# Patient Record
Sex: Male | Born: 1970 | Race: Black or African American | Hispanic: No | Marital: Single | Smoking: Current every day smoker
Health system: Southern US, Community
[De-identification: ages and names within clinical notes are randomized; demographics above are authoritative.]

## PROBLEM LIST (undated history)

## (undated) DIAGNOSIS — I10 Essential (primary) hypertension: Secondary | ICD-10-CM

## (undated) HISTORY — PX: TESTICLE REMOVAL: SHX68

---

## 2007-01-30 ENCOUNTER — Emergency Department: Payer: Self-pay | Admitting: Emergency Medicine

## 2007-04-10 ENCOUNTER — Emergency Department: Payer: Self-pay | Admitting: Emergency Medicine

## 2007-04-16 ENCOUNTER — Encounter: Payer: Self-pay | Admitting: Internal Medicine

## 2007-04-22 ENCOUNTER — Encounter (HOSPITAL_COMMUNITY): Admission: RE | Admit: 2007-04-22 | Discharge: 2007-05-22 | Payer: Self-pay | Admitting: Orthopaedic Surgery

## 2007-04-28 ENCOUNTER — Encounter: Payer: Self-pay | Admitting: Internal Medicine

## 2007-05-24 ENCOUNTER — Encounter (HOSPITAL_COMMUNITY): Admission: RE | Admit: 2007-05-24 | Discharge: 2007-06-23 | Payer: Self-pay | Admitting: Orthopaedic Surgery

## 2007-05-29 ENCOUNTER — Encounter: Payer: Self-pay | Admitting: Internal Medicine

## 2008-09-17 IMAGING — CR DG CHEST 1V
1 series · 1 of 1 positions shown · non-contrast
Comparison: none

REASON FOR EXAM: CRUSH INJURY
COMMENTS:

PROCEDURE:     DXR - DXR CHEST 1 VIEWAP OR PA  - April 10, 2007  [DATE]
RESULT:     The lung fields are clear.  The heart, mediastinal and osseous
structures show no significant abnormalities.

[view not recorded]
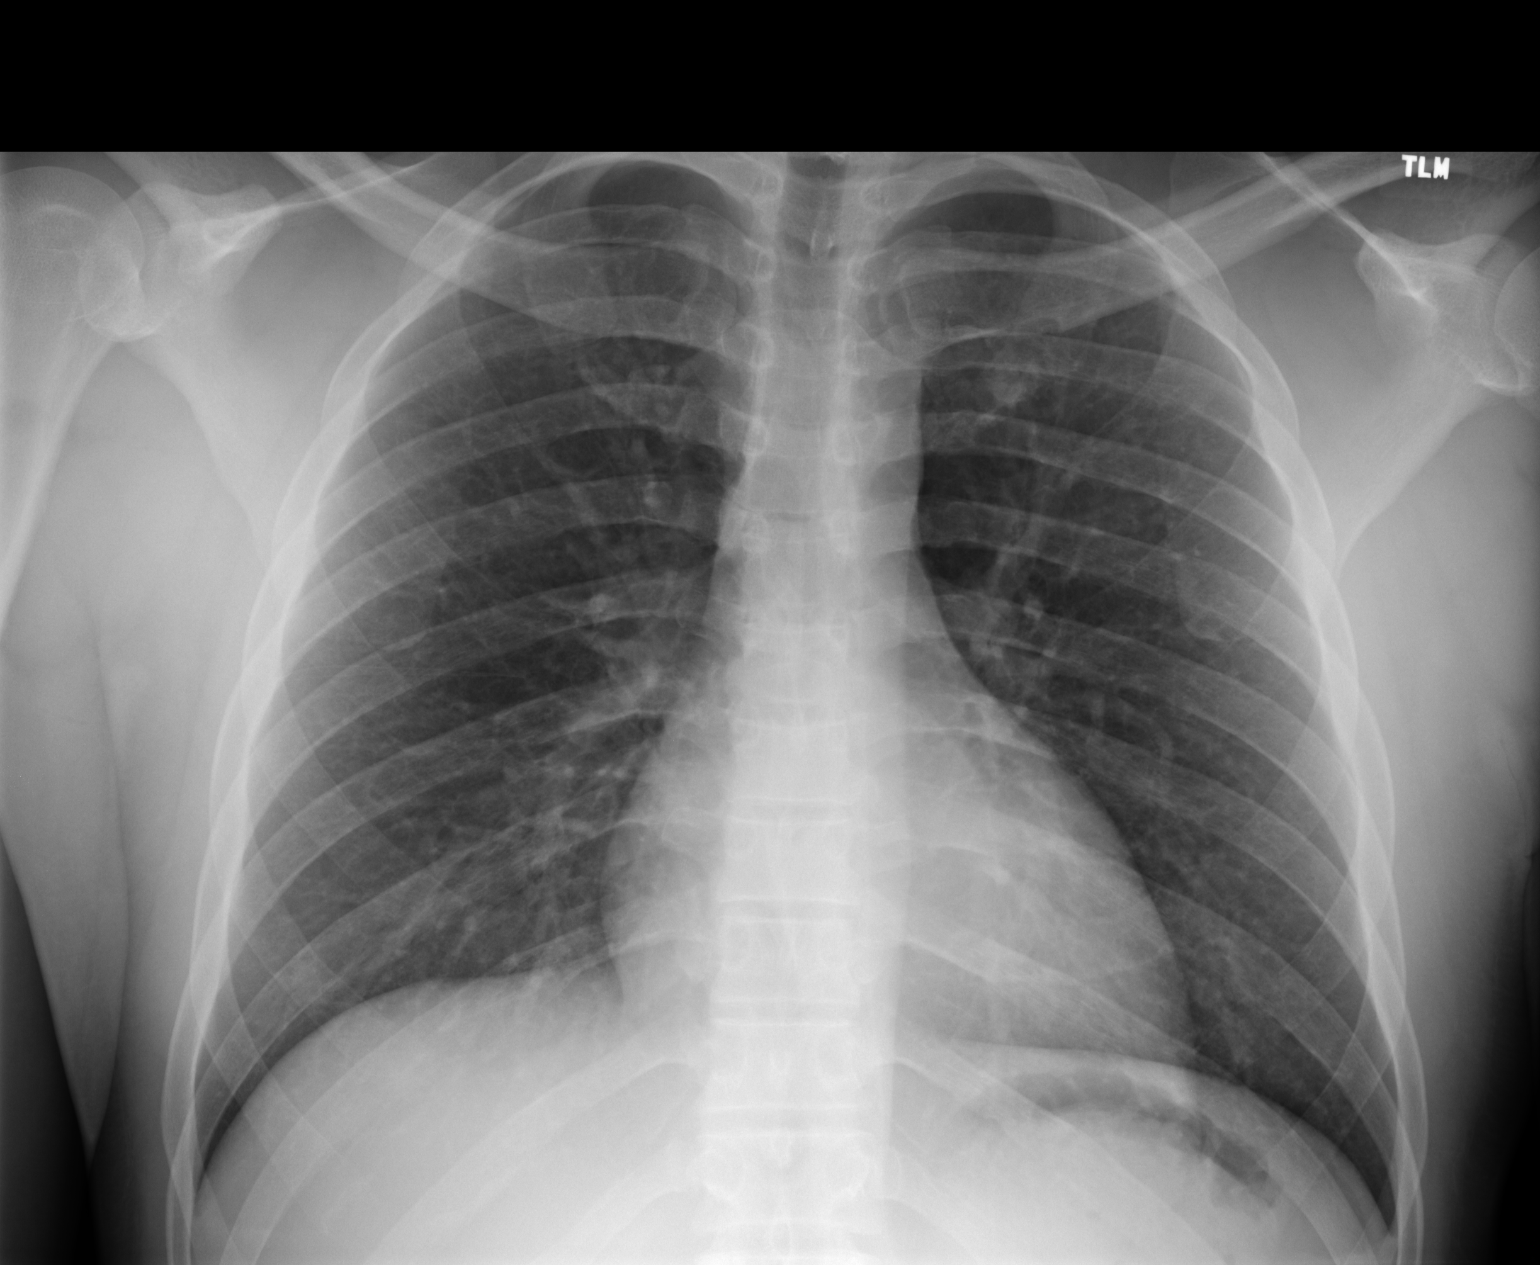

[1 of 1 positions shown; findings below may reference images not displayed]

IMPRESSION: No significant abnormalities are noted.

## 2008-09-17 IMAGING — CR RIGHT FOREARM - 2 VIEW
1 series · 2 of 2 positions shown · non-contrast
Comparison: none

REASON FOR EXAM: crush injury
COMMENTS:

PROCEDURE:     DXR - DXR FOREARM RIGHT  - April 10, 2007  [DATE]
RESULT:     No fracture, dislocation or other acute bony abnormality is
identified. No radiodense soft tissue foreign body is seen.

[Series 1: view not recorded · 0.17mm/px · 2 of 2 slices shown]
[im 1/2]
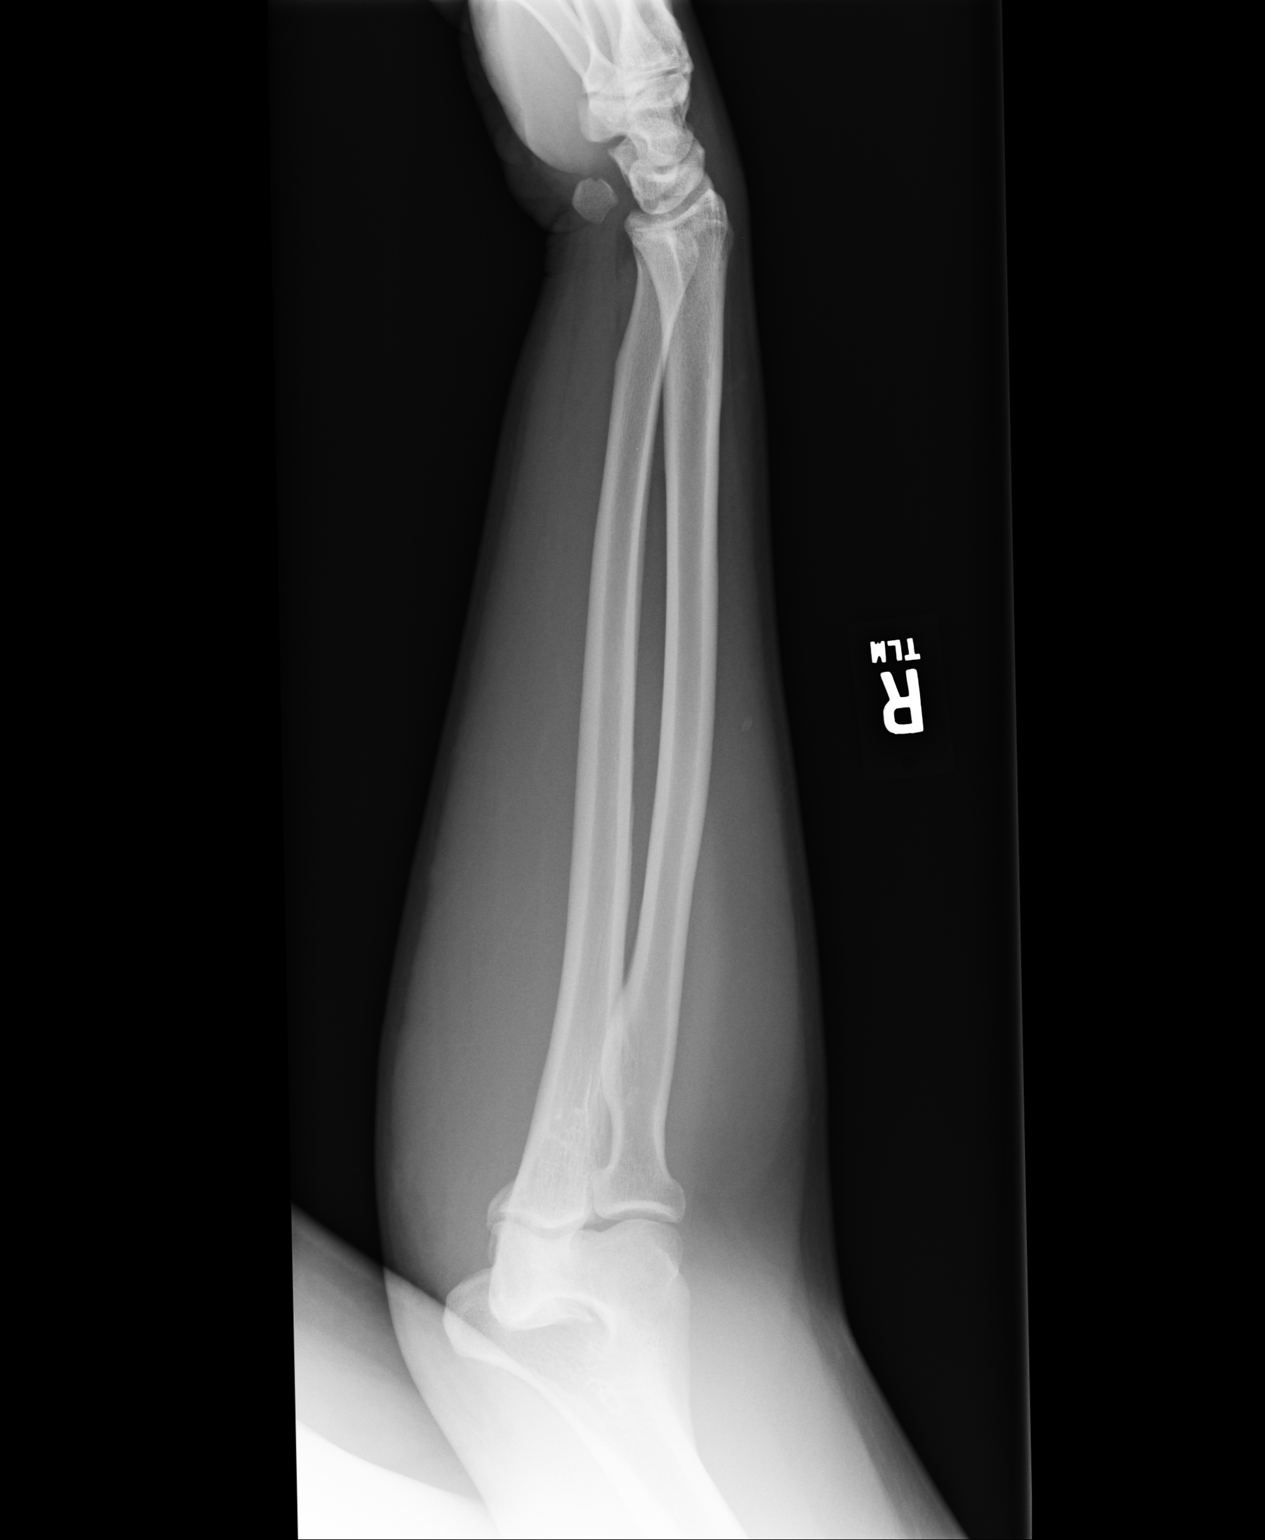
[im 2/2]
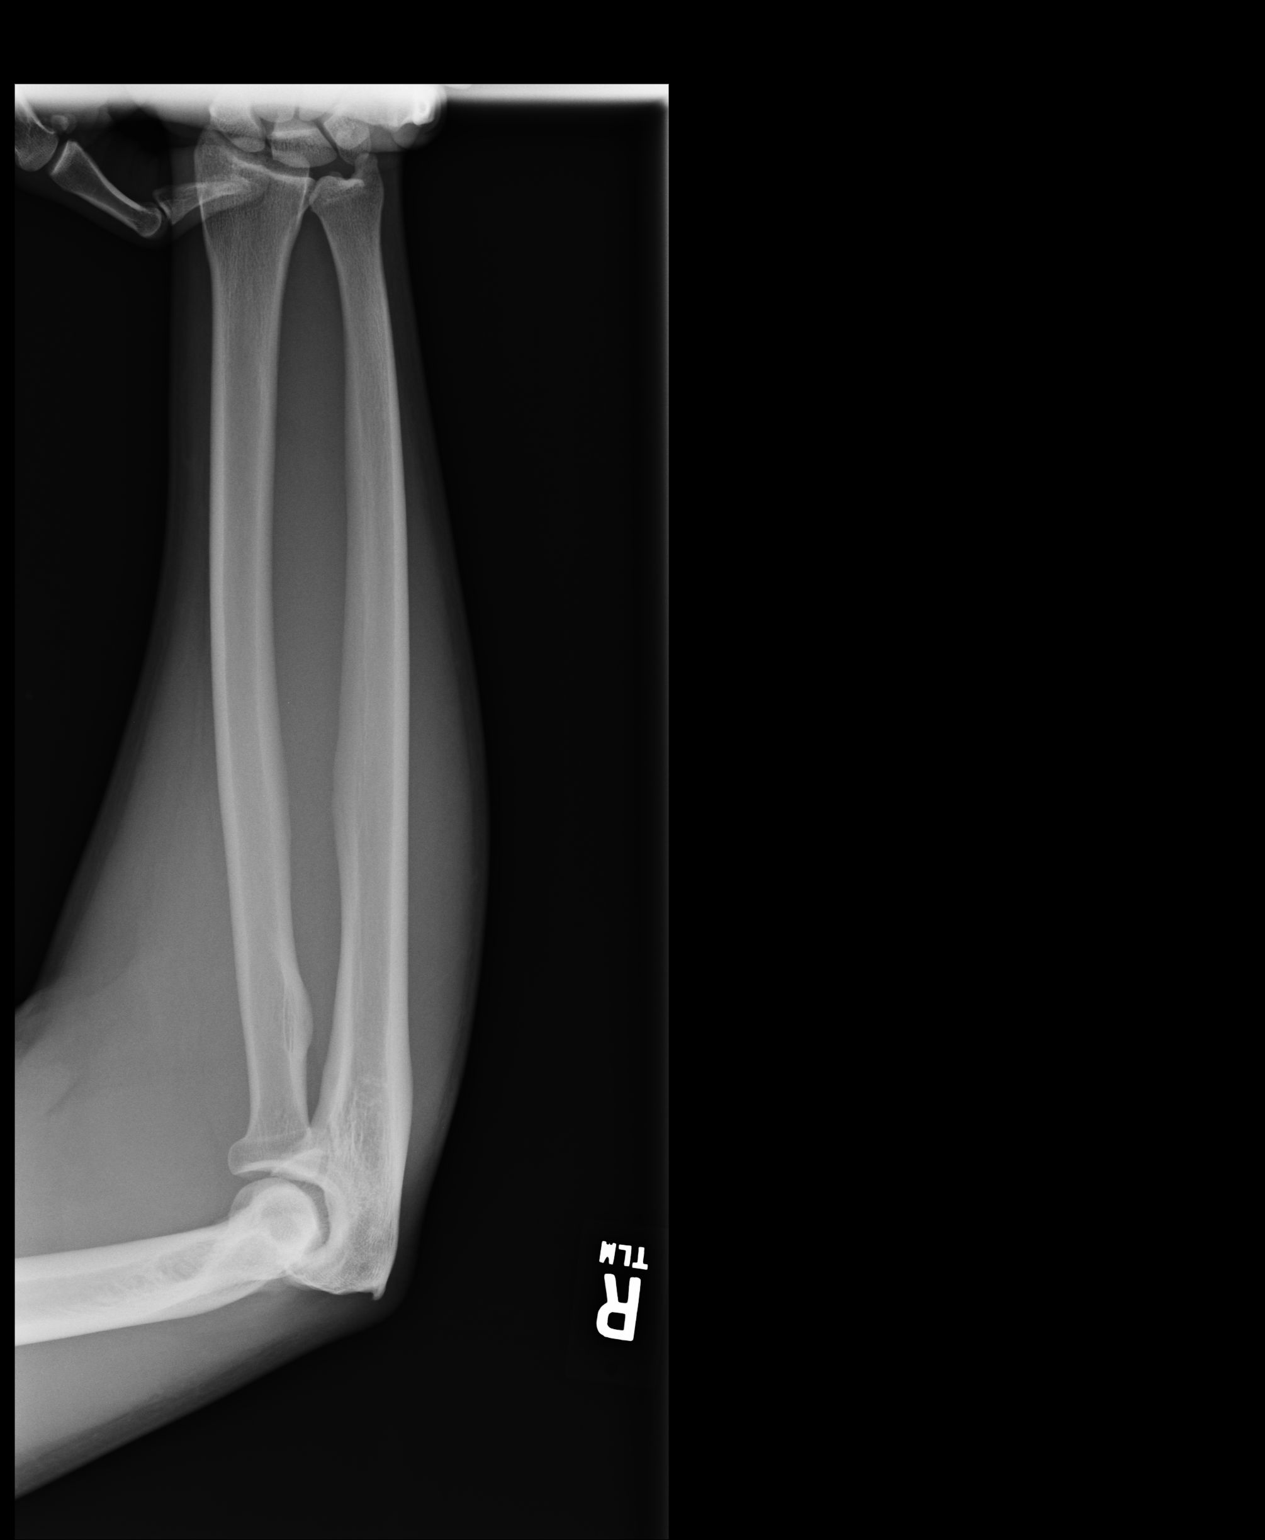

[2 of 2 positions shown; findings below may reference images not displayed]

IMPRESSION: No significant abnormalities are noted.

## 2016-10-17 DIAGNOSIS — R52 Pain, unspecified: Secondary | ICD-10-CM | POA: Insufficient documentation

## 2016-10-17 DIAGNOSIS — R200 Anesthesia of skin: Secondary | ICD-10-CM | POA: Insufficient documentation

## 2020-12-12 ENCOUNTER — Other Ambulatory Visit: Payer: Self-pay

## 2020-12-12 ENCOUNTER — Encounter (HOSPITAL_COMMUNITY): Payer: Self-pay | Admitting: *Deleted

## 2020-12-12 ENCOUNTER — Emergency Department (HOSPITAL_COMMUNITY)
Admission: EM | Admit: 2020-12-12 | Discharge: 2020-12-12 | Disposition: A | Discharge status: Home / Self Care | Payer: BC Managed Care – PPO | Attending: Emergency Medicine | Admitting: Emergency Medicine

## 2020-12-12 DIAGNOSIS — T1591XA Foreign body on external eye, part unspecified, right eye, initial encounter: Secondary | ICD-10-CM

## 2020-12-12 DIAGNOSIS — T1581XA Foreign body in other and multiple parts of external eye, right eye, initial encounter: Secondary | ICD-10-CM | POA: Insufficient documentation

## 2020-12-12 DIAGNOSIS — X58XXXA Exposure to other specified factors, initial encounter: Secondary | ICD-10-CM | POA: Insufficient documentation

## 2020-12-12 DIAGNOSIS — F17210 Nicotine dependence, cigarettes, uncomplicated: Secondary | ICD-10-CM | POA: Diagnosis not present

## 2020-12-12 MED ORDER — TETRACAINE HCL 0.5 % OP SOLN
1.0000 [drp] | Freq: Once | OPHTHALMIC | Status: AC
  Administered 2020-12-12: 1 [drp] via OPHTHALMIC
  Filled 2020-12-12: qty 4

## 2020-12-12 MED ORDER — ERYTHROMYCIN 5 MG/GM OP OINT
TOPICAL_OINTMENT | Freq: Once | OPHTHALMIC | Status: AC
  Administered 2020-12-12: 1 via OPHTHALMIC
  Filled 2020-12-12: qty 3.5

## 2020-12-12 NOTE — ED Provider Notes (Signed)
[facility] EMERGENCY DEPARTMENT Provider Note   CSN: [id number] Arrival date & time: 12/12/20  1522     History Chief Complaint  Patient presents with   Eye Problem    Jonathan Alvarez is a 50 y.o. male.  Patient  here with FB to R eye. Patient states that today around 1pm he was loading wood onto a truck when he felt a piece of wood go into his eye. He states that the discomfort is worse with blinking, better when he keeps his eye closed. He has tearing but denies photophobia or significant pain.  The history is provided by the patient.  Eye Problem Location:  Right eye Quality:  Foreign body sensation Severity:  Moderate Onset quality:  Sudden Duration:  5 hours Timing:  Constant Progression:  Unchanged Chronicity:  New Context: foreign body   Foreign body:  Wood Relieved by:  Nothing Worsened by:  Eye movement Ineffective treatments:  None tried Associated symptoms: redness and tearing   Associated symptoms: no blurred vision, no crusting, no decreased vision and no photophobia       History reviewed. No pertinent past medical history.  There are no problems to display for this patient.   History reviewed. No pertinent surgical history.     No family history on file.  Social History   Tobacco Use   Smoking status: Every Day    Types: Cigarettes   Smokeless tobacco: Never    Home Medications Prior to Admission medications   Not on File    Allergies    Patient has no known allergies.  Review of Systems   Review of Systems  Eyes:  Positive for redness. Negative for blurred vision and photophobia.   Physical Exam Updated Vital Signs BP (!) 149/91 (BP Location: Right Arm)   Pulse 70   Temp 98.4 F (36.9 C) (Oral)   Resp 18   SpO2 96%   Physical Exam Vitals and nursing note reviewed.  Constitutional:      General: He is not in acute distress.    Appearance: He is well-developed. He is not diaphoretic.  HENT:     Head: Normocephalic  and atraumatic.  Eyes:     General: No scleral icterus.    Conjunctiva/sclera: Conjunctivae normal.  Cardiovascular:     Rate and Rhythm: Normal rate and regular rhythm.     Heart sounds: Normal heart sounds.  Pulmonary:     Effort: Pulmonary effort is normal. No respiratory distress.     Breath sounds: Normal breath sounds.  Abdominal:     Palpations: Abdomen is soft.     Tenderness: There is no abdominal tenderness.  Musculoskeletal:     Cervical back: Normal range of motion and neck supple.  Skin:    General: Skin is warm and dry.  Neurological:     Mental Status: He is alert.  Psychiatric:        Behavior: Behavior normal.    ED Results / Procedures / Treatments   Labs (all labs ordered are listed, but only abnormal results are displayed) Labs Reviewed - No data to display  EKG None  Radiology No results found.  Procedures .Foreign Body Removal  Date/Time: 12/12/2020 7:28 PM Performed by: Arthor Captain, PA-C Authorized by: Arthor Captain, PA-C  Consent: Verbal consent obtained. Risks and benefits: risks, benefits and alternatives were discussed Consent given by: patient Patient understanding: patient states understanding of the procedure being performed Patient consent: the patient's understanding of the procedure matches consent  given Patient identity confirmed: provided demographic data Body area: eye Location details: right eyelid  Anesthesia: Local Anesthetic: tetracaine drops Patient cooperative: yes Localization method: eyelid eversion and slit lamp Removal mechanism: moist cotton swab Dressing: antibiotic ointment Depth: superficial Complexity: simple 1 objects recovered. Objects recovered: wood chip Post-procedure assessment: foreign body removed    Medications Ordered in ED Medications  tetracaine (PONTOCAINE) 0.5 % ophthalmic solution 1 drop (1 drop Right Eye Given 12/12/20 1858)  erythromycin ophthalmic ointment (1 application Right  Eye Given 12/12/20 1936)    ED Course  I have reviewed the triage vital signs and the nursing notes.  Pertinent labs & imaging results that were available during my care of the patient were reviewed by me and considered in my medical decision making (see chart for details).    MDM Rules/Calculators/A&P                          Patient here with wood chip in the right thigh.  With chip localized and successfully removed with wet cotton swab.  No evidence of corneal abrasion.  Patient will be treated with erythromycin ointment.  Advised to call ophthalmology tomorrow for close follow-up.  He appears otherwise appropriate for discharge at this time with significant improvement in his symptoms  Final Clinical Impression(s) / ED Diagnoses Final diagnoses:  Foreign body of right eye, initial encounter    Rx / DC Orders ED Discharge Orders     None        Arthor Captain, PA-C 12/13/20 1006    Pricilla Loveless, MD 12/15/20 1457

## 2020-12-12 NOTE — ED Triage Notes (Signed)
Possible foreign body right eye

## 2020-12-12 NOTE — Discharge Instructions (Signed)
Put 1/2 inch strip of erythromycin ointment in the R lower eyelid 4 times a day for the next 5 days. Contact a health care provider if: You have more pain in your eye. You have problems with your eye shield. You have abnormal fluid (discharge) coming from your eye. Get help right away if: Your vision gets worse. You have more redness and swelling in or around your eye.

## 2023-04-13 ENCOUNTER — Encounter: Payer: Self-pay | Admitting: Emergency Medicine

## 2023-04-24 ENCOUNTER — Telehealth: Payer: Self-pay

## 2023-04-24 ENCOUNTER — Other Ambulatory Visit: Payer: Self-pay

## 2023-04-24 DIAGNOSIS — Z1211 Encounter for screening for malignant neoplasm of colon: Secondary | ICD-10-CM

## 2023-04-24 MED ORDER — NA SULFATE-K SULFATE-MG SULF 17.5-3.13-1.6 GM/177ML PO SOLN: 1.0000 | Freq: Once | ORAL | 0 refills | Status: AC

## 2023-04-24 NOTE — Telephone Encounter (Signed)
 Gastroenterology Pre-Procedure Review  Request Date: 05/16/23 Requesting Physician: Dr. Tobi Bastos  PATIENT REVIEW QUESTIONS: The patient responded to the following health history questions as indicated:    1. Are you having any GI issues? no 2. Do you have a personal history of Polyps? no 3. Do you have a family history of Colon Cancer or Polyps? no 4. Diabetes Mellitus? no 5. Joint replacements in the past 12 months?no 6. Major health problems in the past 3 months?no 7. Any artificial heart valves, MVP, or defibrillator?no    MEDICATIONS & ALLERGIES:    Patient reports the following regarding taking any anticoagulation/antiplatelet therapy:   Plavix, Coumadin, Eliquis, Xarelto, Lovenox, Pradaxa, Brilinta, or Effient? no Aspirin? no  Patient confirms/reports the following medications:  No current outpatient medications on file.   No current facility-administered medications for this visit.    Patient confirms/reports the following allergies:  No Known Allergies  No orders of the defined types were placed in this encounter.   AUTHORIZATION INFORMATION Primary Insurance: 1D#: Group #:  Secondary Insurance: 1D#: Group #:  SCHEDULE INFORMATION: Date:  Time: Location:

## 2023-04-24 NOTE — Telephone Encounter (Signed)
 Pt requesting call back to schedule colonoscopy.

## 2023-04-24 NOTE — Telephone Encounter (Signed)
 okay

## 2023-05-16 ENCOUNTER — Ambulatory Visit
Admission: RE | Admit: 2023-05-16 | Discharge: 2023-05-16 | Disposition: A | Discharge status: Home / Self Care | Payer: BC Managed Care – PPO | Attending: Gastroenterology | Admitting: Gastroenterology

## 2023-05-16 ENCOUNTER — Encounter
Admission: RE | Disposition: A | Discharge status: Home / Self Care | Payer: Self-pay | Source: Home / Self Care | Attending: Gastroenterology

## 2023-05-16 ENCOUNTER — Ambulatory Visit: Admitting: Certified Registered Nurse Anesthetist

## 2023-05-16 ENCOUNTER — Encounter: Payer: Self-pay | Admitting: Gastroenterology

## 2023-05-16 DIAGNOSIS — Z1211 Encounter for screening for malignant neoplasm of colon: Secondary | ICD-10-CM | POA: Diagnosis present

## 2023-05-16 DIAGNOSIS — D122 Benign neoplasm of ascending colon: Secondary | ICD-10-CM | POA: Diagnosis not present

## 2023-05-16 DIAGNOSIS — I10 Essential (primary) hypertension: Secondary | ICD-10-CM | POA: Diagnosis not present

## 2023-05-16 DIAGNOSIS — D123 Benign neoplasm of transverse colon: Secondary | ICD-10-CM | POA: Diagnosis not present

## 2023-05-16 DIAGNOSIS — K6389 Other specified diseases of intestine: Secondary | ICD-10-CM

## 2023-05-16 DIAGNOSIS — K635 Polyp of colon: Secondary | ICD-10-CM | POA: Insufficient documentation

## 2023-05-16 DIAGNOSIS — F17210 Nicotine dependence, cigarettes, uncomplicated: Secondary | ICD-10-CM | POA: Insufficient documentation

## 2023-05-16 DIAGNOSIS — D126 Benign neoplasm of colon, unspecified: Secondary | ICD-10-CM

## 2023-05-16 DIAGNOSIS — J449 Chronic obstructive pulmonary disease, unspecified: Secondary | ICD-10-CM | POA: Insufficient documentation

## 2023-05-16 HISTORY — DX: Essential (primary) hypertension: I10

## 2023-05-16 HISTORY — PX: COLONOSCOPY WITH PROPOFOL: SHX5780

## 2023-05-16 HISTORY — PX: POLYPECTOMY: SHX5525

## 2023-05-16 SURGERY — COLONOSCOPY WITH PROPOFOL
Anesthesia: General

## 2023-05-16 MED ORDER — DEXMEDETOMIDINE HCL IN NACL 80 MCG/20ML IV SOLN
INTRAVENOUS | Status: DC | PRN
  Administered 2023-05-16: 12 ug via INTRAVENOUS

## 2023-05-16 MED ORDER — SODIUM CHLORIDE 0.9 % IV SOLN
INTRAVENOUS | Status: DC
Start: 2023-05-16 — End: 2023-05-16

## 2023-05-16 MED ORDER — LIDOCAINE HCL (CARDIAC) PF 100 MG/5ML IV SOSY
PREFILLED_SYRINGE | INTRAVENOUS | Status: DC | PRN
  Administered 2023-05-16: 50 mg via INTRAVENOUS

## 2023-05-16 MED ORDER — PROPOFOL 10 MG/ML IV BOLUS
INTRAVENOUS | Status: DC | PRN
  Administered 2023-05-16: 100 mg via INTRAVENOUS
  Administered 2023-05-16: 125 ug/kg/min via INTRAVENOUS

## 2023-05-16 MED ORDER — PROPOFOL 1000 MG/100ML IV EMUL
INTRAVENOUS | Status: AC
  Filled 2023-05-16: qty 100

## 2023-05-16 NOTE — H&P (Signed)
     Wyline Mood, MD [address], [place], [place], [address] [address], [address], [place], [place], [address] Phone: [phone]  Fax: [phone]  Primary Care Physician:  Emogene Morgan, MD   Pre-Procedure History & Physical: HPI:  Jonathan Alvarez is a 53 y.o. male is here for an colonoscopy.   Past Medical History:  Diagnosis Date   Hypertension     Past Surgical History:  Procedure Laterality Date   TESTICLE REMOVAL     53 years old    Prior to Admission medications   Medication Sig Start Date End Date Taking? Authorizing Provider  atorvastatin (LIPITOR) 10 MG tablet Take 10 mg by mouth daily.    [provider]  Naproxen (NAPROSYN PO) Take 1 tablet by mouth daily after supper.    [provider]    Allergies as of 04/24/2023   (No Known Allergies)    History reviewed. No pertinent family history.  Social History   Socioeconomic History   Marital status: Single    Spouse name: Not on file   Number of children: Not on file   Years of education: Not on file   Highest education level: Not on file  Occupational History   Not on file  Tobacco Use   Smoking status: Every Day    Types: Cigarettes   Smokeless tobacco: Never  Substance and Sexual Activity   Alcohol use: Not Currently   Drug use: Never   Sexual activity: Not on file  Other Topics Concern   Not on file  Social History Narrative   ** Merged History Encounter **       Social Drivers of Health   Financial Resource Strain: Not on file  Food Insecurity: Not on file  Transportation Needs: Not on file  Physical Activity: Not on file  Stress: Not on file  Social Connections: Not on file  Intimate Partner Violence: Not on file    Review of Systems: See HPI, otherwise negative ROS  Physical Exam: BP 115/88   Pulse 76   Temp (!) 97 F (36.1 C) (Temporal)   Resp 18   Ht 6\' 1"  (1.854 m)   SpO2 96%  General:   Alert,  pleasant and cooperative in  NAD Head:  Normocephalic and atraumatic. Neck:  Supple; no masses or thyromegaly. Lungs:  Clear throughout to auscultation, normal respiratory effort.    Heart:  +S1, +S2, Regular rate and rhythm, No edema. Abdomen:  Soft, nontender and nondistended. Normal bowel sounds, without guarding, and without rebound.   Neurologic:  Alert and  oriented x4;  grossly normal neurologically.  Impression/Plan: Jonathan Alvarez is here for an colonoscopy to be performed for Screening colonoscopy average risk   Risks, benefits, limitations, and alternatives regarding  colonoscopy have been reviewed with the patient.  Questions have been answered.  All parties agreeable.   Wyline Mood, MD  05/16/2023, 7:44 AM

## 2023-05-16 NOTE — Op Note (Signed)
 [facility] Gastroenterology Patient Name: Jonathan Alvarez Procedure Date: 05/16/2023 7:20 AM MRN: [id number] Account #: [id number] Date of Birth: 1970/08/05 Admit Type: Outpatient Age: 53 Room: [facility] ENDO ROOM 3 Gender: Male Note Status: Finalized Instrument Name: Nelda Marseille [id number] Procedure:             Colonoscopy Indications:           Screening for colorectal malignant neoplasm Providers:             Wyline Mood MD, MD Referring MD:          Sylvie Farrier. Aycock MD (Referring MD) Medicines:             Monitored Anesthesia Care Complications:         No immediate complications. Procedure:             Pre-Anesthesia Assessment:                        - Prior to the procedure, a History and Physical was                         performed, and patient medications, allergies and                         sensitivities were reviewed. The patient's tolerance                         of previous anesthesia was reviewed.                        - The risks and benefits of the procedure and the                         sedation options and risks were discussed with the                         patient. All questions were answered and informed                         consent was obtained.                        - ASA Grade Assessment: II - A patient with mild                         systemic disease.                        After obtaining informed consent, the colonoscope was                         passed under direct vision. Throughout the procedure,                         the patient's blood pressure, pulse, and oxygen                         saturations were monitored continuously. The                         Colonoscope was  introduced through the anus and                         advanced to the the cecum, identified by the                         appendiceal orifice. The colonoscopy was performed                         with ease. The patient tolerated the procedure well.                          The quality of the bowel preparation was excellent.                         The ileocecal valve, appendiceal orifice, and rectum                         were photographed. Findings:      The perianal and digital rectal examinations were normal.      Two sessile polyps were found in the ascending colon. The polyps were 4       to 5 mm in size. These polyps were removed with a cold snare. Resection       and retrieval were complete.      Two sessile polyps were found in the descending colon and transverse       colon. The polyps were 4 to 5 mm in size. These polyps were removed with       a cold snare. Resection and retrieval were complete.      The exam was otherwise without abnormality on direct and retroflexion       views. Impression:            - Two 4 to 5 mm polyps in the ascending colon, removed                         with a cold snare. Resected and retrieved.                        - Two 4 to 5 mm polyps in the descending colon and in                         the transverse colon, removed with a cold snare.                         Resected and retrieved.                        - The examination was otherwise normal on direct and                         retroflexion views. Recommendation:        - Discharge patient to home (with escort).                        - Resume previous diet.                        - Continue present  medications.                        - Await pathology results.                        - Repeat colonoscopy for surveillance based on                         pathology results. Procedure Code(s):     --- Professional ---                        [phone], Colonoscopy, flexible; with removal of                         tumor(s), polyp(s), or other lesion(s) by snare                         technique Diagnosis Code(s):     --- Professional ---                        Z12.11, Encounter for screening for malignant neoplasm                         of  colon                        D12.2, Benign neoplasm of ascending colon                        D12.4, Benign neoplasm of descending colon                        D12.3, Benign neoplasm of transverse colon (hepatic                         flexure or splenic flexure) CPT copyright 2022 American Medical Association. All rights reserved. The codes documented in this report are preliminary and upon coder review may  be revised to meet current compliance requirements. Wyline Mood, MD Wyline Mood MD, MD 05/16/2023 8:10:15 AM This report has been signed electronically. Number of Addenda: 0 Note Initiated On: 05/16/2023 7:20 AM Scope Withdrawal Time: 0 hours 14 minutes 2 seconds  Total Procedure Duration: 0 hours 17 minutes 55 seconds  Estimated Blood Loss:  Estimated blood loss: none.      [facility]

## 2023-05-16 NOTE — Anesthesia Procedure Notes (Signed)
 Date/Time: 05/16/2023 7:45 AM  Performed by: Ginger Carne, CRNAPre-anesthesia Checklist: Patient identified, Emergency Drugs available, Suction available, Patient being monitored and Timeout performed Patient Re-evaluated:Patient Re-evaluated prior to induction Oxygen Delivery Method: Nasal cannula Preoxygenation: Pre-oxygenation with 100% oxygen Induction Type: IV induction

## 2023-05-16 NOTE — Anesthesia Preprocedure Evaluation (Signed)
 Anesthesia Evaluation  Patient identified by MRN, date of birth, ID band Patient awake    Reviewed: Allergy & Precautions, NPO status , Patient's Chart, lab work & pertinent test results  History of Anesthesia Complications Negative for: history of anesthetic complications  Airway Mallampati: III  TM Distance: <3 FB Neck ROM: full    Dental  (+) Chipped, Poor Dentition   Pulmonary neg shortness of breath, COPD, Current Smoker and Patient abstained from smoking.   Pulmonary exam normal        Cardiovascular Exercise Tolerance: Good hypertension, (-) angina Normal cardiovascular exam     Neuro/Psych negative neurological ROS  negative psych ROS   GI/Hepatic negative GI ROS, Neg liver ROS,neg GERD  ,,  Endo/Other  negative endocrine ROS    Renal/GU negative Renal ROS  negative genitourinary   Musculoskeletal   Abdominal   Peds  Hematology negative hematology ROS (+)   Anesthesia Other Findings Past Medical History: No date: Hypertension  Past Surgical History: No date: TESTICLE REMOVAL     Comment:  53 years old     Reproductive/Obstetrics negative OB ROS                             Anesthesia Physical Anesthesia Plan  ASA: 2  Anesthesia Plan: General   Post-op Pain Management:    Induction: Intravenous  PONV Risk Score and Plan: Propofol infusion and TIVA  Airway Management Planned: Natural Airway and Nasal Cannula  Additional Equipment:   Intra-op Plan:   Post-operative Plan:   Informed Consent: I have reviewed the patients History and Physical, chart, labs and discussed the procedure including the risks, benefits and alternatives for the proposed anesthesia with the patient or authorized representative who has indicated his/her understanding and acceptance.     Dental Advisory Given  Plan Discussed with: Anesthesiologist, CRNA and Surgeon  Anesthesia Plan  Comments: (Patient consented for risks of anesthesia including but not limited to:  - adverse reactions to medications - risk of airway placement if required - damage to eyes, teeth, lips or other oral mucosa - nerve damage due to positioning  - sore throat or hoarseness - Damage to heart, brain, nerves, lungs, other parts of body or loss of life  Patient voiced understanding and assent.)       Anesthesia Quick Evaluation

## 2023-05-16 NOTE — Transfer of Care (Signed)
 Immediate Anesthesia Transfer of Care Note  Patient: Jonathan Alvarez  Procedure(s) Performed: COLONOSCOPY WITH PROPOFOL POLYPECTOMY  Patient Location: Endoscopy Unit  Anesthesia Type:General  Level of Consciousness: sedated and drowsy  Airway & Oxygen Therapy: Patient Spontanous Breathing  Post-op Assessment: Report given to RN and Post -op Vital signs reviewed and stable  Post vital signs: Reviewed and stable  Last Vitals:  Vitals Value Taken Time  BP 95/52 05/16/23 0811  Temp    Pulse 81 05/16/23 0813  Resp 12 05/16/23 0813  SpO2 95 % 05/16/23 0813  Vitals shown include unfiled device data.  Last Pain:  Vitals:   05/16/23 0811  TempSrc:   PainSc: Asleep         Complications: No notable events documented.

## 2023-05-16 NOTE — Anesthesia Postprocedure Evaluation (Signed)
 Anesthesia Post Note  Patient: Jonathan Alvarez  Procedure(s) Performed: COLONOSCOPY WITH PROPOFOL POLYPECTOMY  Patient location during evaluation: Endoscopy Anesthesia Type: General Level of consciousness: awake and alert Pain management: pain level controlled Vital Signs Assessment: post-procedure vital signs reviewed and stable Respiratory status: spontaneous breathing, nonlabored ventilation, respiratory function stable and patient connected to nasal cannula oxygen Cardiovascular status: blood pressure returned to baseline and stable Postop Assessment: no apparent nausea or vomiting Anesthetic complications: no   No notable events documented.   Last Vitals:  Vitals:   05/16/23 0821 05/16/23 0831  BP: 116/74 123/78  Pulse: 82   Resp: 17 15  Temp:    SpO2: 100% 100%    Last Pain:  Vitals:   05/16/23 0831  TempSrc:   PainSc: 0-No pain                 Cleda Mccreedy Neyah Ellerman

## 2023-05-18 LAB — SURGICAL PATHOLOGY

## 2023-05-21 ENCOUNTER — Encounter: Payer: Self-pay | Admitting: Gastroenterology
